# Patient Record
Sex: Male | Born: 1965 | Race: White | Hispanic: No | Marital: Single | Smoking: Former smoker
Health system: Southern US, Community
[De-identification: ages and names within clinical notes are randomized; demographics above are authoritative.]

## PROBLEM LIST (undated history)

## (undated) DIAGNOSIS — M1990 Unspecified osteoarthritis, unspecified site: Secondary | ICD-10-CM

## (undated) DIAGNOSIS — I219 Acute myocardial infarction, unspecified: Secondary | ICD-10-CM

## (undated) DIAGNOSIS — F419 Anxiety disorder, unspecified: Secondary | ICD-10-CM

## (undated) HISTORY — PX: HERNIA REPAIR: SHX51

## (undated) HISTORY — PX: APPENDECTOMY: SHX54

---

## 1998-10-02 DIAGNOSIS — I219 Acute myocardial infarction, unspecified: Secondary | ICD-10-CM

## 1998-10-02 HISTORY — DX: Acute myocardial infarction, unspecified: I21.9

## 2003-06-20 ENCOUNTER — Emergency Department (HOSPITAL_COMMUNITY): Admission: EM | Admit: 2003-06-20 | Discharge: 2003-06-20 | Payer: Self-pay | Admitting: Emergency Medicine

## 2003-09-02 ENCOUNTER — Emergency Department (HOSPITAL_COMMUNITY): Admission: AD | Admit: 2003-09-02 | Discharge: 2003-09-02 | Payer: Self-pay | Admitting: Family Medicine

## 2003-10-07 ENCOUNTER — Emergency Department (HOSPITAL_COMMUNITY): Admission: AD | Admit: 2003-10-07 | Discharge: 2003-10-07 | Payer: Self-pay | Admitting: Family Medicine

## 2003-10-13 ENCOUNTER — Emergency Department (HOSPITAL_COMMUNITY): Admission: AD | Admit: 2003-10-13 | Discharge: 2003-10-13 | Payer: Self-pay | Admitting: Family Medicine

## 2004-04-02 ENCOUNTER — Emergency Department (HOSPITAL_COMMUNITY): Admission: EM | Admit: 2004-04-02 | Discharge: 2004-04-02 | Payer: Self-pay | Admitting: Emergency Medicine

## 2005-03-18 ENCOUNTER — Emergency Department (HOSPITAL_COMMUNITY): Admission: EM | Admit: 2005-03-18 | Discharge: 2005-03-19 | Payer: Self-pay | Admitting: Emergency Medicine

## 2005-03-22 ENCOUNTER — Emergency Department (HOSPITAL_COMMUNITY): Admission: EM | Admit: 2005-03-22 | Discharge: 2005-03-23 | Payer: Self-pay | Admitting: Emergency Medicine

## 2005-09-29 ENCOUNTER — Emergency Department (HOSPITAL_COMMUNITY): Admission: EM | Admit: 2005-09-29 | Discharge: 2005-09-29 | Payer: Self-pay | Admitting: Family Medicine

## 2006-03-16 ENCOUNTER — Ambulatory Visit (HOSPITAL_COMMUNITY): Admission: RE | Admit: 2006-03-16 | Discharge: 2006-03-16 | Payer: Self-pay | Admitting: Sports Medicine

## 2007-01-25 ENCOUNTER — Emergency Department (HOSPITAL_COMMUNITY): Admission: EM | Admit: 2007-01-25 | Discharge: 2007-01-25 | Payer: Self-pay | Admitting: Emergency Medicine

## 2007-04-08 ENCOUNTER — Emergency Department (HOSPITAL_COMMUNITY): Admission: EM | Admit: 2007-04-08 | Discharge: 2007-04-08 | Payer: Self-pay | Admitting: Emergency Medicine

## 2007-10-29 ENCOUNTER — Emergency Department (HOSPITAL_COMMUNITY): Admission: EM | Admit: 2007-10-29 | Discharge: 2007-10-29 | Payer: Self-pay | Admitting: Family Medicine

## 2011-07-18 LAB — URINALYSIS, ROUTINE W REFLEX MICROSCOPIC
Bilirubin Urine: NEGATIVE
Nitrite: NEGATIVE
Specific Gravity, Urine: 1.022
Urobilinogen, UA: 1
pH: 6.5

## 2014-01-29 ENCOUNTER — Encounter (HOSPITAL_COMMUNITY): Payer: Self-pay | Admitting: Emergency Medicine

## 2014-01-29 ENCOUNTER — Emergency Department (HOSPITAL_COMMUNITY)
Admission: EM | Admit: 2014-01-29 | Discharge: 2014-01-29 | Disposition: A | Discharge status: Home / Self Care | Payer: Medicare Other | Attending: Emergency Medicine | Admitting: Emergency Medicine

## 2014-01-29 DIAGNOSIS — I252 Old myocardial infarction: Secondary | ICD-10-CM | POA: Insufficient documentation

## 2014-01-29 DIAGNOSIS — Z8739 Personal history of other diseases of the musculoskeletal system and connective tissue: Secondary | ICD-10-CM | POA: Insufficient documentation

## 2014-01-29 DIAGNOSIS — F411 Generalized anxiety disorder: Secondary | ICD-10-CM | POA: Insufficient documentation

## 2014-01-29 DIAGNOSIS — G479 Sleep disorder, unspecified: Secondary | ICD-10-CM | POA: Insufficient documentation

## 2014-01-29 DIAGNOSIS — K0889 Other specified disorders of teeth and supporting structures: Secondary | ICD-10-CM

## 2014-01-29 DIAGNOSIS — K089 Disorder of teeth and supporting structures, unspecified: Secondary | ICD-10-CM | POA: Insufficient documentation

## 2014-01-29 HISTORY — DX: Anxiety disorder, unspecified: F41.9

## 2014-01-29 HISTORY — DX: Acute myocardial infarction, unspecified: I21.9

## 2014-01-29 HISTORY — DX: Unspecified osteoarthritis, unspecified site: M19.90

## 2014-01-29 MED ORDER — PENICILLIN V POTASSIUM 250 MG PO TABS: 250.0000 mg | ORAL_TABLET | Freq: Four times a day (QID) | ORAL | Status: AC

## 2014-01-29 MED ORDER — TRAMADOL HCL 50 MG PO TABS: 50.0000 mg | ORAL_TABLET | Freq: Four times a day (QID) | ORAL | Status: DC | PRN

## 2014-01-29 NOTE — Discharge Instructions (Signed)

## 2014-01-29 NOTE — ED Provider Notes (Signed)
CSN: [id number]     Arrival date & time 01/29/14  1614 History   First MD Initiated Contact with Patient 01/29/14 1644     Chief Complaint  Patient presents with  . Dental Pain     (Consider location/radiation/quality/duration/timing/severity/associated sxs/prior Treatment) Patient is a 48 y.o. male presenting with tooth pain. The history is provided by the patient. No language interpreter was used.  Dental Pain Location:  Upper Quality:  Aching Severity:  Moderate Onset quality:  Gradual Timing:  Constant Progression:  Worsening Chronicity:  Recurrent Context: dental caries and poor dentition   Relieved by:  Acetaminophen Worsened by:  Cold food/drink, hot food/drink and touching Associated symptoms: no drooling, no facial pain and no fever   Risk factors: no diabetes     Past Medical History  Diagnosis Date  . Arthritis   . Anxiety   . MI (myocardial infarction) 2000   No past surgical history on file. No family history on file. History  Substance Use Topics  . Smoking status: Not on file  . Smokeless tobacco: Not on file  . Alcohol Use: Not on file    Review of Systems  Constitutional: Negative for fever and chills.  HENT: Positive for dental problem. Negative for drooling.   Neurological: Negative for speech difficulty.  Psychiatric/Behavioral: Positive for sleep disturbance.      Allergies  Review of patient's allergies indicates no known allergies.  Home Medications   Prior to Admission medications   Medication Sig Start Date End Date Taking? Authorizing Provider  penicillin v potassium (VEETID) 250 MG tablet Take 1 tablet (250 mg total) by mouth 4 (four) times daily. 01/29/14 02/05/14  Roxy Horsemanobert Daleiza Bacchi, PA-C  traMADol (ULTRAM) 50 MG tablet Take 1 tablet (50 mg total) by mouth every 6 (six) hours as needed. 01/29/14   Roxy Horsemanobert Heywood Tokunaga, PA-C   BP 169/101  Pulse 99  Temp(Src) 98.9 F (37.2 C) (Oral)  Resp 18  Ht 5\' 10"  (1.778 m)  Wt 204 lb (92.534 kg)   BMI 29.27 kg/m2  SpO2 94% Physical Exam  Nursing note and vitals reviewed. Constitutional: He is oriented to person, place, and time. He appears well-developed and well-nourished.  HENT:  Head: Normocephalic and atraumatic.  Mouth/Throat:    Poor dentition throughout.  Affected tooth as diagrammed.  No signs of peritonsillar or tonsillar abscess.  No signs of gingival abscess. Oropharynx is clear and without exudates.  Uvula is midline.  Airway is intact. No signs of Ludwig's angina with palpation of oral and sublingual mucosa.   Eyes: Conjunctivae and EOM are normal.  Neck: Normal range of motion.  Cardiovascular: Normal rate.   Pulmonary/Chest: Effort normal.  Abdominal: He exhibits no distension.  Musculoskeletal: Normal range of motion.  Neurological: He is alert and oriented to person, place, and time.  Skin: Skin is dry.  Psychiatric: He has a normal mood and affect. His behavior is normal. Judgment and thought content normal.    ED Course  Procedures (including critical care time) Labs Review Labs Reviewed - No data to display  Imaging Review No results found.   EKG Interpretation None      MDM   Final diagnoses:  Pain, dental    Patient with toothache.  No gross abscess.  Exam unconcerning for Ludwig's angina or spread of infection.  Will treat with penicillin and pain medicine.  Urged patient to follow-up with dentist.       Roxy Horsemanobert Keaten Mashek, PA-C 01/29/14 1655

## 2014-01-29 NOTE — ED Notes (Signed)
PT ambulated with baseline gait; VSS; A&Ox3; no signs of distress; respirations even and unlabored; skin warm and dry; no questions upon discharge.  

## 2014-01-29 NOTE — ED Notes (Signed)
Pt having dental pain and swelling.

## 2014-01-30 NOTE — ED Provider Notes (Signed)
Medical screening examination/treatment/procedure(s) were performed by non-physician practitioner and as supervising physician I was immediately available for consultation/collaboration.  Hasel Janish L Silas Muff, MD 01/30/14 0038 

## 2014-04-01 ENCOUNTER — Encounter (HOSPITAL_COMMUNITY): Payer: Self-pay | Admitting: Emergency Medicine

## 2014-04-01 ENCOUNTER — Emergency Department (HOSPITAL_COMMUNITY): Payer: Medicare Other

## 2014-04-01 ENCOUNTER — Emergency Department (HOSPITAL_COMMUNITY)
Admission: EM | Admit: 2014-04-01 | Discharge: 2014-04-01 | Disposition: A | Discharge status: Home / Self Care | Payer: Medicare Other | Attending: Emergency Medicine | Admitting: Emergency Medicine

## 2014-04-01 DIAGNOSIS — I252 Old myocardial infarction: Secondary | ICD-10-CM | POA: Insufficient documentation

## 2014-04-01 DIAGNOSIS — W2203XA Walked into furniture, initial encounter: Secondary | ICD-10-CM | POA: Diagnosis not present

## 2014-04-01 DIAGNOSIS — F411 Generalized anxiety disorder: Secondary | ICD-10-CM | POA: Insufficient documentation

## 2014-04-01 DIAGNOSIS — Z87891 Personal history of nicotine dependence: Secondary | ICD-10-CM | POA: Insufficient documentation

## 2014-04-01 DIAGNOSIS — Y9301 Activity, walking, marching and hiking: Secondary | ICD-10-CM | POA: Diagnosis not present

## 2014-04-01 DIAGNOSIS — S97109A Crushing injury of unspecified toe(s), initial encounter: Secondary | ICD-10-CM | POA: Insufficient documentation

## 2014-04-01 DIAGNOSIS — Z8739 Personal history of other diseases of the musculoskeletal system and connective tissue: Secondary | ICD-10-CM | POA: Diagnosis not present

## 2014-04-01 DIAGNOSIS — Y929 Unspecified place or not applicable: Secondary | ICD-10-CM | POA: Insufficient documentation

## 2014-04-01 DIAGNOSIS — S8990XA Unspecified injury of unspecified lower leg, initial encounter: Secondary | ICD-10-CM | POA: Diagnosis present

## 2014-04-01 DIAGNOSIS — S99919A Unspecified injury of unspecified ankle, initial encounter: Secondary | ICD-10-CM | POA: Diagnosis present

## 2014-04-01 DIAGNOSIS — S97121A Crushing injury of right lesser toe(s), initial encounter: Secondary | ICD-10-CM

## 2014-04-01 MED ORDER — NAPROXEN 500 MG PO TABS: 500.0000 mg | ORAL_TABLET | Freq: Two times a day (BID) | ORAL | Status: AC

## 2014-04-01 NOTE — ED Notes (Signed)
Pt states that he stubbed his right fifth digit on a char 2 nights ago, and the swelling has been getting worse.

## 2014-04-01 NOTE — ED Provider Notes (Signed)
CSN: [id number]     Arrival date & time 04/01/14  [id number] History   First MD Initiated Contact with Patient 04/01/14 [phone]     Chief Complaint  Patient presents with  . Toe Pain     (Consider location/radiation/quality/duration/timing/severity/associated sxs/prior Treatment) HPI Comments: Patient is a 48 year old male who presents with right fifth toe pain that started 2 night ago when he hit it on a chair when walking. The pain started immediately and has remained constant since the onset. The pain is aching and severe. Walking makes the pain worse. No alleviating factors. Patient did not try anything for pain at home. No other injuries.    Past Medical History  Diagnosis Date  . Arthritis   . Anxiety   . MI (myocardial infarction) 2000   Past Surgical History  Procedure Laterality Date  . Appendectomy    . Hernia repair     No family history on file. History  Substance Use Topics  . Smoking status: Former Games developermoker  . Smokeless tobacco: Not on file  . Alcohol Use: No    Review of Systems  Constitutional: Negative for fever, chills and fatigue.  HENT: Negative for trouble swallowing.   Eyes: Negative for visual disturbance.  Respiratory: Negative for shortness of breath.   Cardiovascular: Negative for chest pain and palpitations.  Gastrointestinal: Negative for nausea, vomiting, abdominal pain and diarrhea.  Genitourinary: Negative for dysuria and difficulty urinating.  Musculoskeletal: Positive for arthralgias. Negative for neck pain.  Skin: Positive for color change.  Neurological: Negative for dizziness and weakness.  Psychiatric/Behavioral: Negative for dysphoric mood.      Allergies  Review of patient's allergies indicates no known allergies.  Home Medications   Prior to Admission medications   Medication Sig Start Date End Date Taking? Authorizing Provider  traMADol (ULTRAM) 50 MG tablet Take 1 tablet (50 mg total) by mouth every 6 (six) hours as needed. 01/29/14    Roxy Horsemanobert Browning, PA-C   BP 145/86  Pulse 83  Temp(Src) 98.7 F (37.1 C) (Oral)  Resp 20  Ht 5\' 8"  (1.727 m)  Wt 204 lb (92.534 kg)  BMI 31.03 kg/m2  SpO2 99% Physical Exam  Nursing note and vitals reviewed. Constitutional: He is oriented to person, place, and time. He appears well-developed and well-nourished. No distress.  HENT:  Head: Normocephalic and atraumatic.  Eyes: Conjunctivae are normal.  Neck: Normal range of motion.  Cardiovascular: Normal rate and regular rhythm.  Exam reveals no gallop and no friction rub.   No murmur heard. Pulmonary/Chest: Effort normal and breath sounds normal. He has no wheezes. He has no rales. He exhibits no tenderness.  Musculoskeletal: Normal range of motion.  Mild bruising and generalized tenderness to palpation of right 5th toe. No obvious deformity.   Neurological: He is alert and oriented to person, place, and time. Coordination normal.  Speech is goal-oriented. Moves limbs without ataxia.   Skin: Skin is warm and dry.  Psychiatric: He has a normal mood and affect. His behavior is normal.    ED Course  Procedures (including critical care time) Labs Review Labs Reviewed - No data to display  Imaging Review Dg Toe 5th Right  04/01/2014   CLINICAL DATA:  Toe injury with pain.  EXAM: RIGHT FIFTH TOE  COMPARISON:  None.  FINDINGS: Three view exam of the little toe shows no fracture. No subluxation or dislocation. No worrisome lytic or sclerotic osseous abnormality.  IMPRESSION: No acute bony findings.   Electronically Signed  By: Kennith CenterEric  Mansell M.D.   On: 04/01/2014 07:20     EKG Interpretation None      MDM   Final diagnoses:  Crushing injury of fifth toe, right, initial encounter    6:58 AM Xray pending. Vitals stable and patient afebrile.   7:33 AM Xray shows no acute fracture. Patient will be discharged with Naprosyn for pain. No other injury. No further evaluation needed at this time.   Emilia BeckKaitlyn Kadian Barcellos,  PA-C 04/01/14 1313

## 2014-04-01 NOTE — ED Provider Notes (Signed)
Medical screening examination/treatment/procedure(s) were performed by non-physician practitioner and as supervising physician I was immediately available for consultation/collaboration.   EKG Interpretation None        Glynn OctaveStephen Rydan Gulyas, MD 04/01/14 [phone]

## 2014-04-01 NOTE — Discharge Instructions (Signed)
Take Naprosyn as needed for pain. Rest, ice and elevate your toe. Refer to attached documents for more information.

## 2016-02-10 IMAGING — CR DG TOE 5TH 2+V*R*
3 series · 3 of 3 positions shown · non-contrast
Comparison: None.

CLINICAL DATA: Toe injury with pain.

EXAM:
RIGHT FIFTH TOE

[x toes ap right]
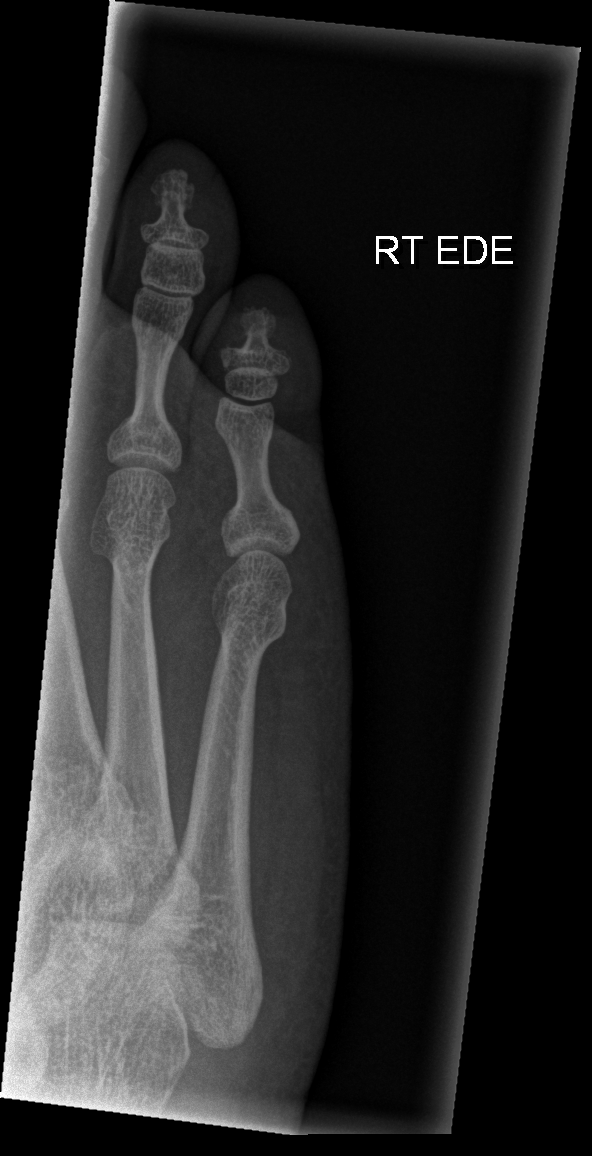

[x toes obl right]
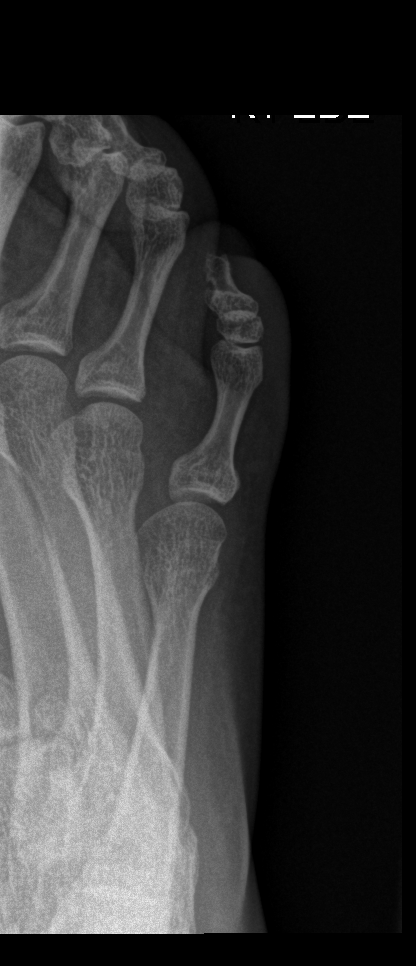

[x toes lat right]
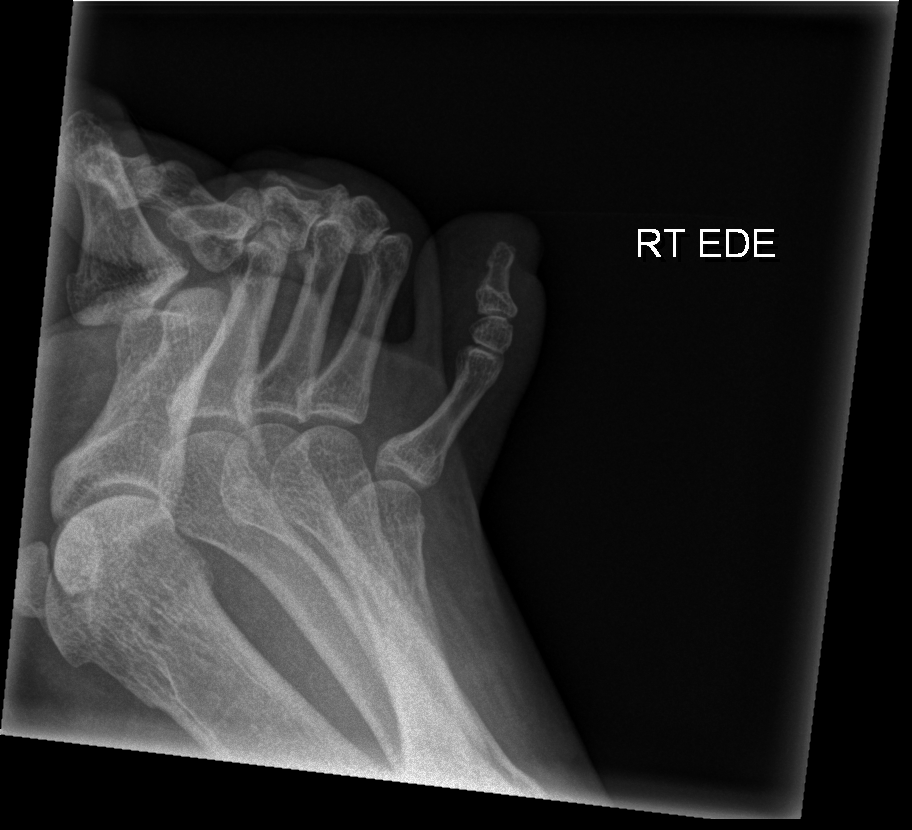

[3 of 3 positions shown; findings below may reference images not displayed]

FINDINGS: Three view exam of the little toe shows no fracture. No subluxation
or dislocation. No worrisome lytic or sclerotic osseous abnormality.
IMPRESSION: No acute bony findings.
